# Patient Record
Sex: Male | Born: 1970 | Race: White | Hispanic: No | Marital: Single | Smoking: Current every day smoker
Health system: Southern US, Community
[De-identification: ages and names within clinical notes are randomized; demographics above are authoritative.]

## PROBLEM LIST (undated history)

## (undated) DIAGNOSIS — J449 Chronic obstructive pulmonary disease, unspecified: Secondary | ICD-10-CM

## (undated) DIAGNOSIS — J45909 Unspecified asthma, uncomplicated: Secondary | ICD-10-CM

## (undated) HISTORY — PX: HEMORRHOID SURGERY: SHX153

## (undated) HISTORY — PX: TONSILLECTOMY: SUR1361

## (undated) HISTORY — DX: Unspecified asthma, uncomplicated: J45.909

## (undated) HISTORY — PX: WRIST SURGERY: SHX841

---

## 2005-02-17 ENCOUNTER — Emergency Department: Payer: Self-pay | Admitting: Emergency Medicine

## 2005-11-05 ENCOUNTER — Emergency Department: Payer: Self-pay | Admitting: Emergency Medicine

## 2007-01-07 ENCOUNTER — Emergency Department: Payer: Self-pay | Admitting: Emergency Medicine

## 2007-07-08 ENCOUNTER — Emergency Department: Payer: Self-pay | Admitting: Emergency Medicine

## 2009-05-31 ENCOUNTER — Emergency Department: Payer: Self-pay | Admitting: Emergency Medicine

## 2011-02-01 ENCOUNTER — Emergency Department: Payer: Self-pay | Admitting: *Deleted

## 2011-09-04 ENCOUNTER — Emergency Department: Payer: Self-pay | Admitting: Emergency Medicine

## 2015-03-06 ENCOUNTER — Ambulatory Visit: Payer: Self-pay | Admitting: Urology

## 2015-03-09 ENCOUNTER — Ambulatory Visit: Payer: Self-pay | Admitting: Urology

## 2015-03-13 ENCOUNTER — Ambulatory Visit: Payer: Self-pay | Admitting: Obstetrics and Gynecology

## 2015-03-20 ENCOUNTER — Encounter: Payer: Self-pay | Admitting: Obstetrics and Gynecology

## 2015-03-20 ENCOUNTER — Ambulatory Visit (INDEPENDENT_AMBULATORY_CARE_PROVIDER_SITE_OTHER): Payer: Self-pay | Admitting: Obstetrics and Gynecology

## 2015-03-20 VITALS — BP 113/78 | HR 73 | Resp 16 | Ht 73.0 in | Wt 138.1 lb

## 2015-03-20 DIAGNOSIS — N5082 Scrotal pain: Secondary | ICD-10-CM

## 2015-03-20 DIAGNOSIS — R310 Gross hematuria: Secondary | ICD-10-CM

## 2015-03-20 DIAGNOSIS — R591 Generalized enlarged lymph nodes: Secondary | ICD-10-CM

## 2015-03-20 LAB — URINALYSIS, COMPLETE
BILIRUBIN UA: NEGATIVE
Glucose, UA: NEGATIVE
Ketones, UA: NEGATIVE
Leukocytes, UA: NEGATIVE
NITRITE UA: NEGATIVE
PROTEIN UA: NEGATIVE
Specific Gravity, UA: 1.015 (ref 1.005–1.030)
UUROB: 0.2 mg/dL (ref 0.2–1.0)
pH, UA: 5.5 (ref 5.0–7.5)

## 2015-03-20 LAB — MICROSCOPIC EXAMINATION
BACTERIA UA: NONE SEEN
Epithelial Cells (non renal): NONE SEEN /hpf (ref 0–10)
Renal Epithel, UA: NONE SEEN /hpf
WBC UA: NONE SEEN /HPF (ref 0–?)

## 2015-03-20 NOTE — Progress Notes (Signed)
03/20/2015 7:30 PM   Zachary Delgado 05-10-1971 [id number]  Referring provider: No referring provider defined for this encounter.  Chief Complaint  Patient presents with  . Bleeding after ejaculation  . Establish Care    self referral    HPI: Patient is a 44 year old male presenting today as a self-referral with complaints of gross hematuria after intercourse for the last 3 weeks. Patient states that he is experience significant gross hematuria immediately after he's had intercourse with his wife. He has actually saved some urine during one of these episodes and brought it in a ziplock bag with him today as he states he was so shocks by it . He presented a sandwich size bag with a significant amount of bright red blood and clots. He states that the hematuria will occur for a few hours after intercourse and then will slowly resolve. He does experience dysuria during these episodes but otherwise denies any urinary symptoms.  Additional symptoms include scrotal discomfort last night and unexplained weight loss of 10 pounds over the last 2 months.  No daily H2O intake.  Current smoker 32yrs 2ppd.    PMH: Past Medical History  Diagnosis Date  . Asthma     Surgical History: Past Surgical History  Procedure Laterality Date  . Tonsillectomy    . Hemorrhoid surgery    . Wrist surgery Left     Home Medications:    Medication List       This list is accurate as of: 03/20/15 11:59 PM.  Always use your most recent med list.               acetaminophen 500 MG tablet  Commonly known as:  TYLENOL  Take 1,000 mg by mouth.        Allergies: No Known Allergies  Family History: Family History  Problem Relation Age of Onset  . Lung cancer Father   . Lung cancer Brother   . Bone cancer Paternal Uncle     Social History:  reports that he has been smoking.  He does not have any smokeless tobacco history on file. He reports that he uses illicit drugs (Marijuana). He  reports that he does not drink alcohol.  ROS: UROLOGY Frequent Urination?: Yes Hard to postpone urination?: Yes Burning/pain with urination?: No Get up at night to urinate?: Yes Leakage of urine?: Yes Urine stream starts and stops?: No Trouble starting stream?: Yes Do you have to strain to urinate?: No Blood in urine?: Yes Urinary tract infection?: No Sexually transmitted disease?: No Injury to kidneys or bladder?: No Painful intercourse?: No Weak stream?: No Erection problems?: No Penile pain?: No  Gastrointestinal Nausea?: No Vomiting?: No Indigestion/heartburn?: No Diarrhea?: No Constipation?: No  Constitutional Fever: No Night sweats?: Yes Weight loss?: Yes Fatigue?: Yes  Skin Skin rash/lesions?: No Itching?: No  Eyes Blurred vision?: No Double vision?: No  Ears/Nose/Throat Sore throat?: No Sinus problems?: No  Hematologic/Lymphatic Swollen glands?: No Easy bruising?: No  Cardiovascular Leg swelling?: No Chest pain?: No  Respiratory Cough?: No Shortness of breath?: No  Endocrine Excessive thirst?: No  Musculoskeletal Back pain?: No Joint pain?: No  Neurological Headaches?: No Dizziness?: No  Psychologic Depression?: No Anxiety?: No  Physical Exam: BP 113/78 mmHg  Pulse 73  Resp 16  Ht  (1.854 m)  Wt 138 lb 1.6 oz (62.642 kg)  BMI 18.22 kg/m2  Constitutional:  Alert and oriented, No acute distress. HEENT: [place] AT, moist mucus membranes.  Trachea midline, no masses.  Cardiovascular: No clubbing, cyanosis, or edema. Respiratory: Normal respiratory effort, no increased work of breathing. GI: Abdomen is soft, nontender, nondistended, no abdominal masses GU: No CVA tenderness. Circumcised phallus, normal urinary meatus, testicles distended bilaterally without masses or tenderness  Skin: No rashes, bruises or suspicious lesions. Lymph: No cervical adenopathy, Small firm palpable right inguinal node Neurologic: Grossly intact, no  focal deficits, moving all 4 extremities. Psychiatric: Normal mood and affect.  Laboratory Data: No results found for: WBC, HGB, HCT, MCV, PLT  No results found for: CREATININE  No results found for: PSA  No results found for: TESTOSTERONE  No results found for: HGBA1C  Urinalysis    Component Value Date/Time   GLUCOSEU Negative 03/20/2015 1608   BILIRUBINUR Negative 03/20/2015 1608   NITRITE Negative 03/20/2015 1608   LEUKOCYTESUR Negative 03/20/2015 1608    Pertinent Imaging:   Assessment & Plan:    1. Gross hematuria- Multiple episodes of gross hematuria over the last 3 weeks. Patient does have a significant smoking history. Also reports unexplained 10 pound weight loss over the last 3 months. We discussed the differential diagnosis for hematuria including nephrolithiasis, renal or upper tract tumors, bladder stones, UTIs, or bladder tumors as well as undetermined etiologies. Per [facility] guidelines, I did recommend complete microscopic hematuria evaluation including CTU, possible urine cytology, and office cystoscopy. Patient states understanding and is willing to proceed with plan. He does state however that he does not have insurance. I have requested that front desk staff refer patient for financial assistance. - Urinalysis, Complete  2. Scrotal pain- Bilateral scrotal pain beginning last night. Patient reports that pain is resolved today. Testicles nontender on exam.  3. Lymphadenopathy- Small firm palpable right inguinal node.   Return for CT Urogram results; schedule cystosocpy.  Earlie Lou, FNP  [facility] [address], [address] [place], [place] [address] [phone]

## 2015-03-21 ENCOUNTER — Telehealth: Payer: Self-pay | Admitting: Obstetrics and Gynecology

## 2015-03-21 NOTE — Telephone Encounter (Signed)
I ordered a CT urogram for this patient for gross hematuria workup. It is very important that he get it. I spoke with Annabelle Harman when he was checking out about referring him for financial assistance because he does not have insurance. Can you please follow-up on this? Patient needs to have a CT scan as soon as possible. I sent this message both to [facility] clinical and adminThanks

## 2015-04-18 ENCOUNTER — Other Ambulatory Visit: Payer: Self-pay

## 2015-04-26 ENCOUNTER — Telehealth: Payer: Self-pay | Admitting: Obstetrics and Gynecology

## 2015-04-26 NOTE — Telephone Encounter (Signed)
I CALLED THIS PATIENT TO FIND OUT WHY HE CANCELLED HIS CYSTO AND REFUSED TO HAVE HIS CT SCAN. HE REFUSED TO COME TO THE PHONE AND I WAS TOLD THAT WE DIDN'T DO ANYTHING FOR HIM AND THAT HE WAS NOT COMING BACK AND NOT TO CALL BACK. HE DIDN'T HAVE ANYTHING TO SAY TO US.   THANKS, MICHELLE

## 2015-05-15 NOTE — Telephone Encounter (Signed)
We please send this patient a certified letter. I want to be sure that he understands that we may possibly be missing a cancer in his urinary tract if he does not follow through with CT scan and cystoscopy as I recommended at his previous visit. This could result in a potential cancer not being treated and worse case scenario lead to death. Thanks

## 2015-05-15 NOTE — Telephone Encounter (Signed)
Routing message to office manager Glenice BowLisa Hartley to send certified letter.

## 2017-10-08 ENCOUNTER — Other Ambulatory Visit: Payer: Self-pay

## 2017-10-08 ENCOUNTER — Emergency Department: Payer: Self-pay

## 2017-10-08 ENCOUNTER — Encounter: Payer: Self-pay | Admitting: *Deleted

## 2017-10-08 ENCOUNTER — Emergency Department
Admission: EM | Admit: 2017-10-08 | Discharge: 2017-10-08 | Disposition: A | Discharge status: Home / Self Care | Payer: Self-pay | Attending: Emergency Medicine | Admitting: Emergency Medicine

## 2017-10-08 DIAGNOSIS — F17210 Nicotine dependence, cigarettes, uncomplicated: Secondary | ICD-10-CM | POA: Insufficient documentation

## 2017-10-08 DIAGNOSIS — R091 Pleurisy: Secondary | ICD-10-CM | POA: Insufficient documentation

## 2017-10-08 DIAGNOSIS — J449 Chronic obstructive pulmonary disease, unspecified: Secondary | ICD-10-CM | POA: Insufficient documentation

## 2017-10-08 HISTORY — DX: Chronic obstructive pulmonary disease, unspecified: J44.9

## 2017-10-08 LAB — BASIC METABOLIC PANEL
Anion gap: 6 (ref 5–15)
BUN: 6 mg/dL (ref 6–20)
CHLORIDE: 102 mmol/L (ref 101–111)
CO2: 25 mmol/L (ref 22–32)
CREATININE: 0.99 mg/dL (ref 0.61–1.24)
Calcium: 8.7 mg/dL — ABNORMAL LOW (ref 8.9–10.3)
Glucose, Bld: 79 mg/dL (ref 65–99)
POTASSIUM: 3.4 mmol/L — AB (ref 3.5–5.1)
SODIUM: 133 mmol/L — AB (ref 135–145)

## 2017-10-08 LAB — CBC
HCT: 43.2 % (ref 40.0–52.0)
Hemoglobin: 14.9 g/dL (ref 13.0–18.0)
MCH: 32.6 pg (ref 26.0–34.0)
MCHC: 34.4 g/dL (ref 32.0–36.0)
MCV: 94.5 fL (ref 80.0–100.0)
PLATELETS: 209 10*3/uL (ref 150–440)
RBC: 4.57 MIL/uL (ref 4.40–5.90)
RDW: 12.7 % (ref 11.5–14.5)
WBC: 7.4 10*3/uL (ref 3.8–10.6)

## 2017-10-08 LAB — TROPONIN I: Troponin I: <0.03 ng/mL (ref <0.03)

## 2017-10-08 MED ORDER — NAPROXEN 500 MG PO TABS: 500.0000 mg | ORAL_TABLET | Freq: Two times a day (BID) | ORAL | 0 refills | Status: AC | PRN

## 2017-10-08 NOTE — ED Provider Notes (Signed)
[facility] Emergency Department Provider Note  ___________________________________________   First MD Initiated Contact with Patient 10/08/17 1821     (approximate)  I have reviewed the triage vital signs and the nursing notes.   HISTORY  Chief Complaint Chest Pain   HPI Zachary Delgado is a 47 y.o. male with a history of asthma, COPD and pleurisy was presenting to the emergency department today with 6 months of right-sided chest pain.  The patient says that over the past 6 months he has had right-sided chest pain with deep breathing as well as coughing.  He says that actually this chest pain is been ongoing for years and usually worsens as the temperature increases.  He says that he had a coughing spell several days ago and the pain became severe.  He said that he also took about 12 ibuprofen over 6 hours yesterday which improved the pain.  No vomiting.  No abdominal pain.  Says that he has no pain at rest right now but it creates a sharp pain about the size of a tennis ball to the right anterior chest when he takes a deep breath.  Does not report any hemoptysis.  Denies any shortness of breath at rest.   Past Medical History:  Diagnosis Date  . Asthma   . COPD (chronic obstructive pulmonary disease) (HCC)     There are no active problems to display for this patient.   Past Surgical History:  Procedure Laterality Date  . HEMORRHOID SURGERY    . TONSILLECTOMY    . WRIST SURGERY Left     Prior to Admission medications   Medication Sig Start Date End Date Taking? Authorizing Provider  acetaminophen (TYLENOL) 500 MG tablet Take 1,000 mg by mouth.    [provider]    Allergies Patient has no known allergies.  Family History  Problem Relation Age of Onset  . Lung cancer Father   . Lung cancer Brother   . Bone cancer Paternal Uncle     Social History Social History   Tobacco Use  . Smoking status: Current Every Day Smoker   Packs/day: 1.50    Types: Cigarettes  . Smokeless tobacco: Never Used  Substance Use Topics  . Alcohol use: No    Alcohol/week: 0.0 oz  . Drug use: Yes    Types: Marijuana    Review of Systems  Constitutional: No fever/chills Eyes: No visual changes. ENT: No sore throat. Cardiovascular: as above Respiratory: As above Gastrointestinal: No abdominal pain.  No nausea, no vomiting.  No diarrhea.  No constipation. Genitourinary: Negative for dysuria. Musculoskeletal: Negative for back pain. Skin: Negative for rash. Neurological: Negative for headaches, focal weakness or numbness.   ____________________________________________   PHYSICAL EXAM:  VITAL SIGNS: ED Triage Vitals  Enc Vitals Group     BP 10/08/17 1524 129/76     Pulse Rate 10/08/17 1524 (!) 56     Resp 10/08/17 1524 16     Temp 10/08/17 1524 98.4 F (36.9 C)     Temp Source 10/08/17 1524 Oral     SpO2 10/08/17 1524 100 %     Weight 10/08/17 1525 145 lb (65.8 kg)     Height 10/08/17 1525  (1.854 m)     Head Circumference --      Peak Flow --      Pain Score 10/08/17 1524 1     Pain Loc --      Pain Edu? --  Excl. in GC? --     Constitutional: Alert and oriented. Well appearing and in no acute distress. Eyes: Conjunctivae are normal.  Head: Atraumatic. Nose: No congestion/rhinnorhea. Mouth/Throat: Mucous membranes are moist.  Neck: No stridor.   Cardiovascular: Normal rate, regular rhythm. Grossly normal heart sounds.  Chest pain not reproducible to palpation. Respiratory: Normal respiratory effort.  No retractions. Lungs CTAB. Gastrointestinal: Soft and nontender. No distention.  Musculoskeletal: No lower extremity tenderness nor edema.  No joint effusions. Neurologic:  Normal speech and language. No gross focal neurologic deficits are appreciated. Skin:  Skin is warm, dry and intact. No rash noted. Psychiatric: Mood and affect are normal. Speech and behavior are  normal.  ____________________________________________   LABS (all labs ordered are listed, but only abnormal results are displayed)  Labs Reviewed  BASIC METABOLIC PANEL - Abnormal; Notable for the following components:      Result Value   Sodium 133 (*)    Potassium 3.4 (*)    Calcium 8.7 (*)    All other components within normal limits  CBC  TROPONIN I   ____________________________________________  EKG  ED ECG REPORT I, Arelia Longest, the attending physician, personally viewed and interpreted this ECG.   Date: 10/08/2017  EKG Time: 1507  Rate: 53  Rhythm: normal sinus rhythm  Axis: Normal  Intervals:none  ST&T Change: No ST segment elevation or depression.  No abnormal T wave inversion.  ____________________________________________  RADIOLOGY  Normal chest radiograph ____________________________________________   PROCEDURES  Procedure(s) performed:   Procedures  Critical Care performed:   ____________________________________________   INITIAL IMPRESSION / ASSESSMENT AND PLAN / ED COURSE  Pertinent labs & imaging results that were available during my care of the patient were reviewed by me and considered in my medical decision making (see chart for details).  Differential diagnosis includes, but is not limited to, ACS, aortic dissection, pulmonary embolism, cardiac tamponade, pneumothorax, pneumonia, pericarditis, myocarditis, GI-related causes including esophagitis/gastritis, and musculoskeletal chest wall pain.   As part of my medical decision making, I reviewed the following data within the electronic MEDICAL RECORD NUMBER Notes from prior ED visits  PE RC negative.  Atypical story for ACS.  History and physical consistent with pleurisy as the patient has had in the past.  We discussed taking medications as prescribed.  I will give him several days of Aleve to go home with but I told him may be easier on his kidneys and is a twice a day medication.  We  discussed the dangers of overdosing on medication, specifically ibuprofen, which can cause kidney damage as well as vomiting blood.  We also discussed decreasing smoking.  Patient says that he smokes about 1/2 pack/day at this time but used to smoke 3 packs/day.  I gave him praise for his progress and he knows that he must try to keep reducing the amount of cigarettes he is smoking. ____________________________________________   FINAL CLINICAL IMPRESSION(S) / ED DIAGNOSES  Pleurisy.    NEW MEDICATIONS STARTED DURING THIS VISIT:  New Prescriptions   No medications on file     Note:  This document was prepared using Dragon voice recognition software and may include unintentional dictation errors.     Myrna Blazer, MD 10/08/17 [phone]

## 2017-10-08 NOTE — ED Triage Notes (Signed)
Pulled for EKG.

## 2017-10-08 NOTE — ED Triage Notes (Signed)
Pt to ED reporting right sided chest pain x 6 months that started hurting worse today after an episode of coughing. Pt reports having had pleurisy in the past. Pt took ibuprofen at 1300 and has had a decrease in pain level. No tenderness upon palpation. No SOB or increased WOB.

## 2017-10-08 NOTE — ED Notes (Signed)
Pt left without waiting for discharge instructions or allowing VS recheck. Pt seen leaving ED ambulating with steady gait along with family.

## 2019-02-09 IMAGING — CR DG CHEST 2V
1 series · 2 of 2 positions shown · non-contrast
Comparison: 06/30/2007

CLINICAL DATA: Right chest pain x6 months

EXAM:
CHEST - 2 VIEW

[Series 1: dg chest 2 view · 0.14mm/px · 2 of 2 slices shown]
[im 1/2]
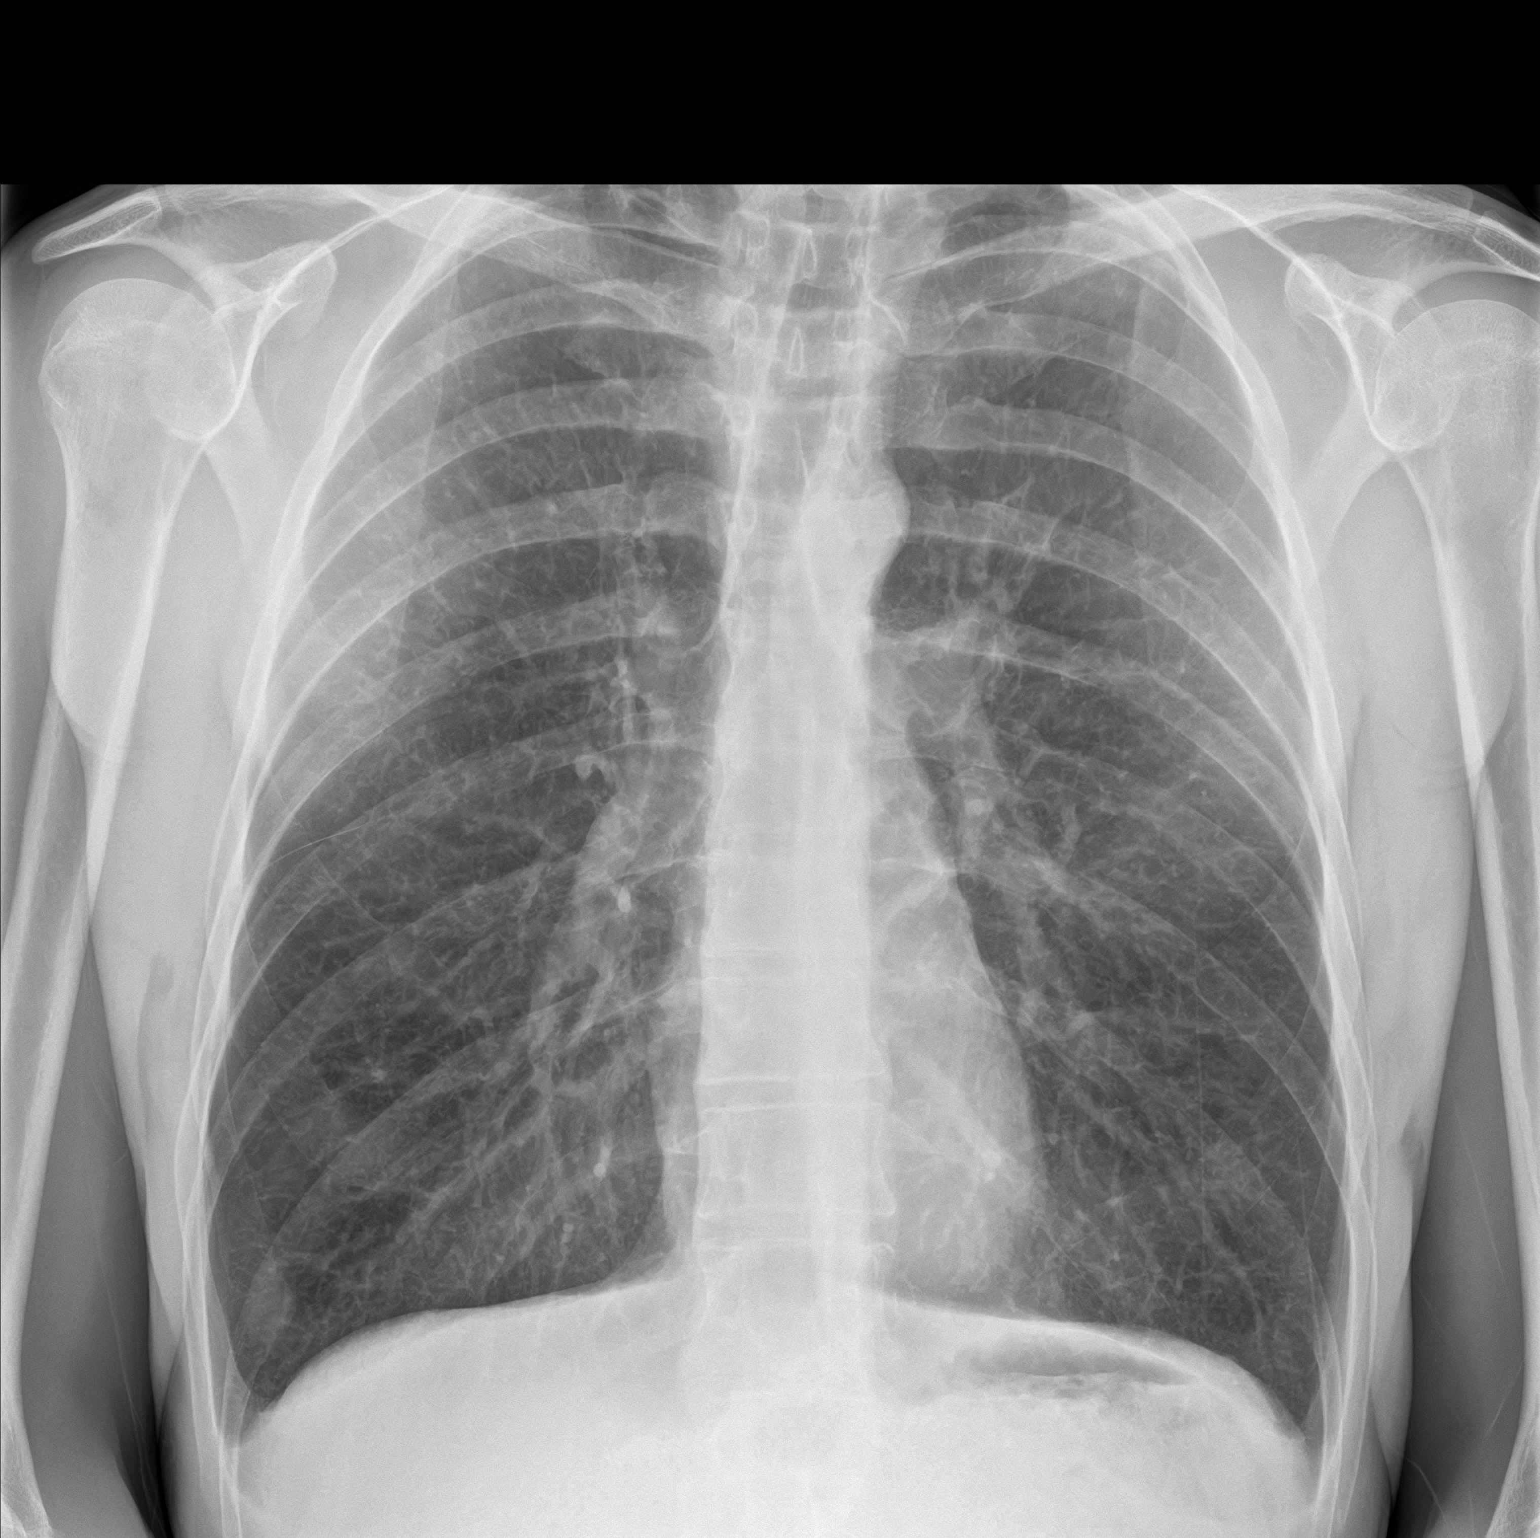
[im 2/2]
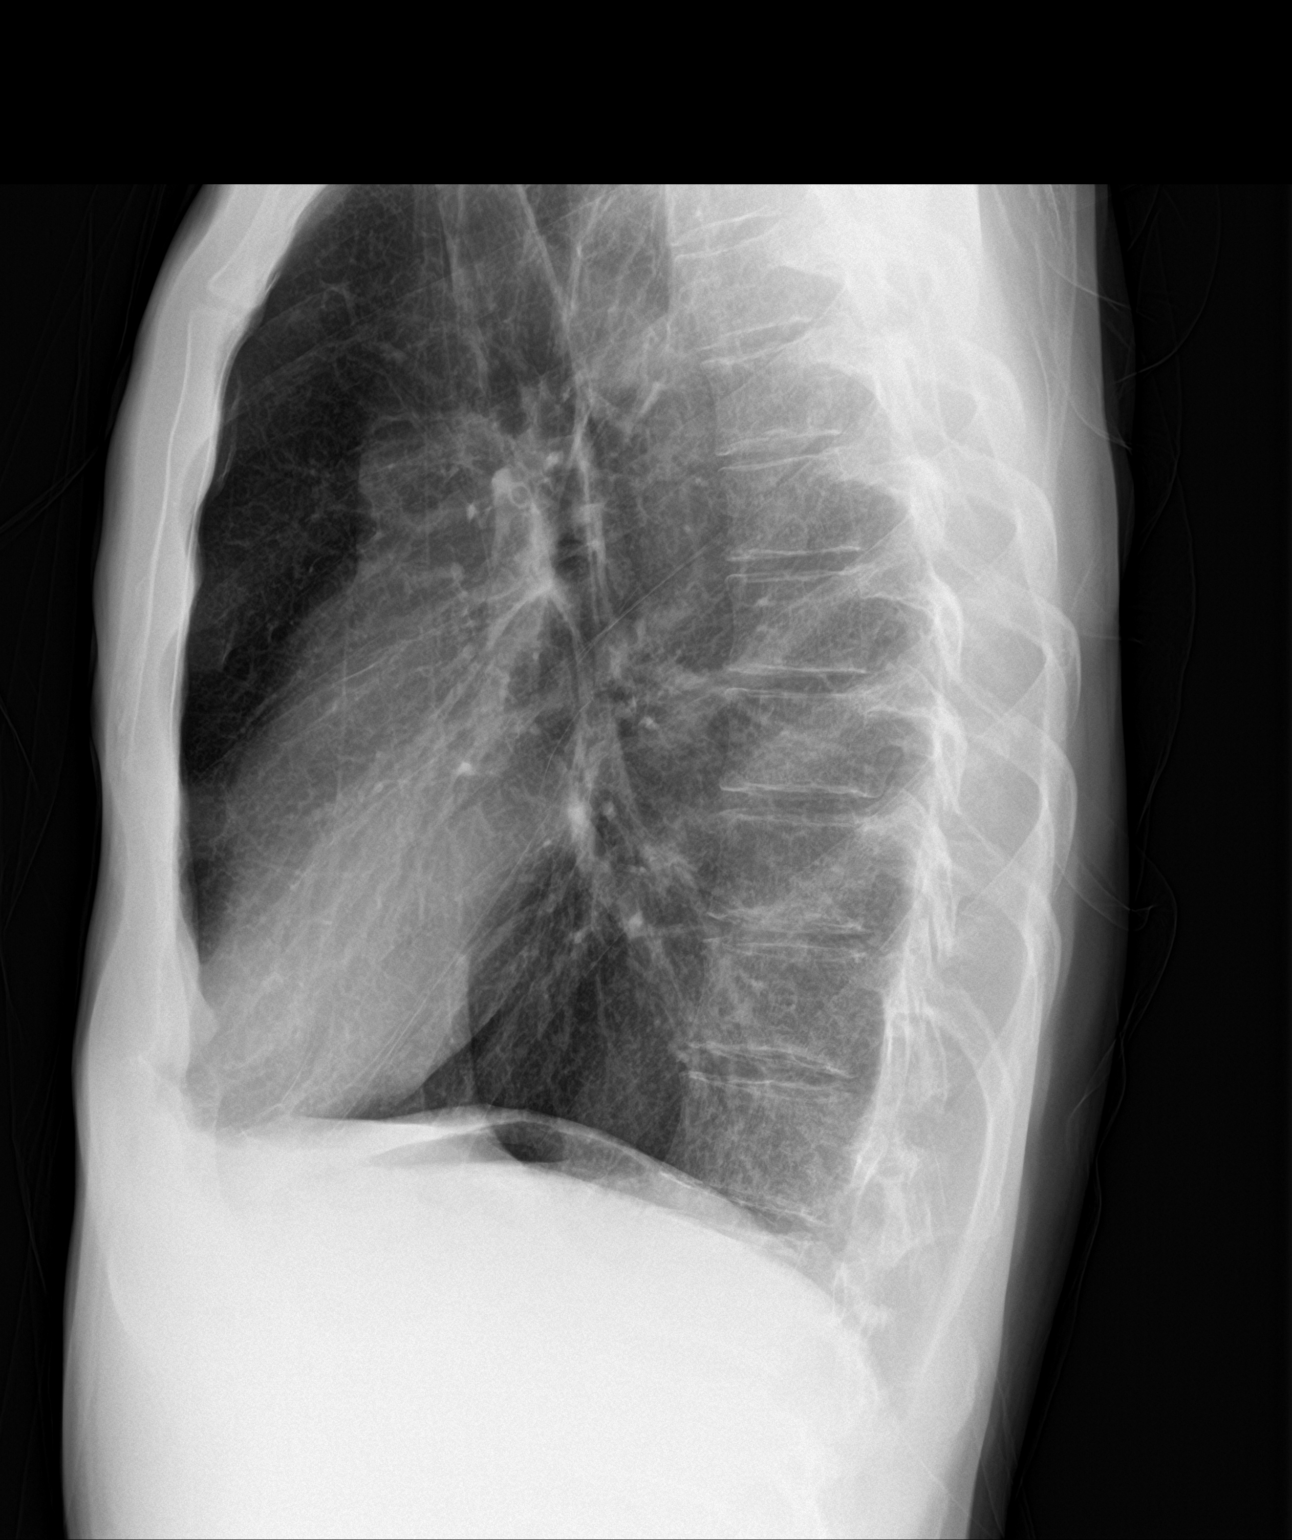

[2 of 2 positions shown; findings below may reference images not displayed]

FINDINGS: Lungs are clear.  No pleural effusion or pneumothorax.

The heart is normal in size.

Visualized osseous structures are within normal limits.
IMPRESSION: Normal chest radiographs.
